# Patient Record
Sex: Female | Born: 1990 | Hispanic: Yes | Marital: Single | Smoking: Never smoker
Health system: Southern US, Community
[De-identification: ages and names within clinical notes are randomized; demographics above are authoritative.]

## PROBLEM LIST (undated history)

## (undated) DIAGNOSIS — Z789 Other specified health status: Secondary | ICD-10-CM

## (undated) HISTORY — PX: NO PAST SURGERIES: SHX2092

## (undated) HISTORY — DX: Other specified health status: Z78.9

---
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
## 2011-03-05 ENCOUNTER — Observation Stay: Payer: Self-pay | Admitting: Obstetrics and Gynecology

## 2011-03-05 LAB — URINALYSIS, COMPLETE
Bacteria: NONE SEEN
Bilirubin,UR: NEGATIVE
Blood: NEGATIVE
Glucose,UR: NEGATIVE mg/dL (ref 0–75)
Ketone: NEGATIVE
Ph: 7 (ref 4.5–8.0)
Specific Gravity: 1.008 (ref 1.003–1.030)
Squamous Epithelial: 1

## 2011-03-21 ENCOUNTER — Observation Stay: Payer: Self-pay

## 2011-05-23 ENCOUNTER — Inpatient Hospital Stay: Payer: Self-pay | Admitting: Obstetrics and Gynecology

## 2011-05-23 LAB — CBC WITH DIFFERENTIAL/PLATELET
Basophil #: 0 10*3/uL (ref 0.0–0.1)
Basophil %: 0.1 %
Eosinophil #: 0 10*3/uL (ref 0.0–0.7)
Eosinophil %: 0.4 %
HCT: 39 % (ref 35.0–47.0)
HGB: 12.9 g/dL (ref 12.0–16.0)
Lymphocyte #: 1 10*3/uL (ref 1.0–3.6)
MCHC: 33.1 g/dL (ref 32.0–36.0)
MCV: 90 fL (ref 80–100)
Monocyte #: 0.6 x10 3/mm (ref 0.2–0.9)
Monocyte %: 6.4 %
Neutrophil %: 81.9 %
RBC: 4.33 10*6/uL (ref 3.80–5.20)

## 2011-05-24 LAB — HEMOGLOBIN: HGB: 10.8 g/dL — ABNORMAL LOW (ref 12.0–16.0)

## 2011-06-17 ENCOUNTER — Emergency Department: Payer: Self-pay | Admitting: *Deleted

## 2011-06-17 LAB — CBC
HCT: 39.9 % (ref 35.0–47.0)
HGB: 12.8 g/dL (ref 12.0–16.0)
MCH: 28.3 pg (ref 26.0–34.0)
MCHC: 32.1 g/dL (ref 32.0–36.0)
MCV: 88 fL (ref 80–100)
RBC: 4.51 10*6/uL (ref 3.80–5.20)
RDW: 15.2 % — ABNORMAL HIGH (ref 11.5–14.5)
WBC: 8.2 10*3/uL (ref 3.6–11.0)

## 2011-06-17 LAB — URINALYSIS, COMPLETE
Bacteria: NEGATIVE
Bilirubin,UR: NEGATIVE
Protein: NEGATIVE
Specific Gravity: 1.014 (ref 1.003–1.030)

## 2011-06-17 LAB — COMPREHENSIVE METABOLIC PANEL
Albumin: 3.6 g/dL — ABNORMAL LOW (ref 3.8–5.6)
Bilirubin,Total: 0.2 mg/dL (ref 0.2–1.0)
Calcium, Total: 9 mg/dL (ref 9.0–10.7)
Chloride: 106 mmol/L (ref 98–107)
Co2: 26 mmol/L (ref 21–32)
EGFR (African American): >60
Glucose: 94 mg/dL (ref 65–99)
Osmolality: 283 (ref 275–301)
Potassium: 3.9 mmol/L (ref 3.5–5.1)
SGOT(AST): 30 U/L — ABNORMAL HIGH (ref 0–26)
SGPT (ALT): 46 U/L
Sodium: 141 mmol/L (ref 136–145)

## 2011-06-17 LAB — PREGNANCY, URINE: Pregnancy Test, Urine: NEGATIVE m[IU]/mL

## 2013-04-30 ENCOUNTER — Ambulatory Visit: Payer: Self-pay | Admitting: Advanced Practice Midwife

## 2013-06-05 ENCOUNTER — Observation Stay: Payer: Self-pay

## 2013-06-05 LAB — URINALYSIS, COMPLETE
BILIRUBIN, UR: NEGATIVE
BLOOD: NEGATIVE
Bacteria: NONE SEEN
GLUCOSE, UR: NEGATIVE mg/dL (ref 0–75)
KETONE: NEGATIVE
Leukocyte Esterase: NEGATIVE
Nitrite: NEGATIVE
PH: 8 (ref 4.5–8.0)
PROTEIN: NEGATIVE
RBC, UR: NONE SEEN /HPF (ref 0–5)
Specific Gravity: 1.009 (ref 1.003–1.030)
WBC UR: 1 /HPF (ref 0–5)

## 2013-09-16 ENCOUNTER — Observation Stay: Payer: Self-pay

## 2013-10-29 ENCOUNTER — Inpatient Hospital Stay: Payer: Self-pay

## 2013-10-29 LAB — GC/CHLAMYDIA PROBE AMP

## 2013-10-29 LAB — CBC WITH DIFFERENTIAL/PLATELET
BASOS ABS: 0.1 10*3/uL (ref 0.0–0.1)
Basophil %: 0.9 %
Eosinophil #: 0.2 10*3/uL (ref 0.0–0.7)
Eosinophil %: 2 %
HCT: 36.7 % (ref 35.0–47.0)
HGB: 11.9 g/dL — AB (ref 12.0–16.0)
LYMPHS PCT: 22.7 %
Lymphocyte #: 2.4 10*3/uL (ref 1.0–3.6)
MCH: 28.9 pg (ref 26.0–34.0)
MCHC: 32.4 g/dL (ref 32.0–36.0)
MCV: 89 fL (ref 80–100)
MONOS PCT: 9.5 %
Monocyte #: 1 x10 3/mm — ABNORMAL HIGH (ref 0.2–0.9)
Neutrophil #: 6.8 10*3/uL — ABNORMAL HIGH (ref 1.4–6.5)
Neutrophil %: 64.9 %
Platelet: 210 10*3/uL (ref 150–440)
RBC: 4.11 10*6/uL (ref 3.80–5.20)
RDW: 14.1 % (ref 11.5–14.5)
WBC: 10.5 10*3/uL (ref 3.6–11.0)

## 2013-10-29 LAB — RAPID HIV SCREEN (HIV 1/2 AB+AG)

## 2013-10-30 LAB — HEMATOCRIT: HCT: 27.6 % — ABNORMAL LOW (ref 35.0–47.0)

## 2014-06-21 NOTE — H&P (Signed)
L&D Evaluation:  History:  HPI 23yo Hispanic female speaks English with PNC at [facility] here for labor since 8p last night. Pt has a EDD of 10/28/13, No ROM, VB, decreased FM. Prenatal records are not  found in L&D amd pt acknowledges that she has no medical problems or hx.   Presents with contractions   Patient's Medical History No Chronic Illness   Patient's Surgical History none   Medications Pre Natal Vitamins   Allergies NKDA   Social History none   Family History Non-Contributory   ROS:  ROS All systems were reviewed.  HEENT, CNS, GI, GU, Respiratory, CV, Renal and Musculoskeletal systems were found to be normal.   Exam:  Vital Signs stable   General no apparent distress   Mental Status clear   Chest clear   Heart normal sinus rhythm, no murmur/gallop/rubs   Abdomen gravid, non-tender   Estimated Fetal Weight Average for gestational age   Back no CVAT   Reflexes 1+   Clonus negative   Pelvic last exam was 5/80/vtx   Mebranes Intact   FHT 145, pt sitting up and nurses readjusting toco   Ucx regular   Skin dry   Lymph no lymphadenopathy   Impression:  Impression active labor   Plan:  Plan antibiotics for GBBS prophylaxis   Comments Admit for delivery. Get [facility] records when they open. Antic SVD.   Electronic Signatures: Sharee PimpleJones, Akire Rennert W (CNM)  (Signed 18-Sep-15 07:38)  Authored: L&D Evaluation   Last Updated: 18-Sep-15 07:38 by Sharee PimpleJones, Carmellia Kreisler W (CNM)

## 2014-06-21 NOTE — H&P (Signed)
L&D Evaluation:  History:   HPI 24 y/o HF EDC 05/29/11 by 5 week U/S    Presents with back pain, bilateral    Patient's Medical History No Chronic Illness    Patient's Surgical History none    Medications Pre Natal Vitamins    Allergies NKDA    Social History none    Family History Non-Contributory   ROS:   ROS All systems were reviewed.  HEENT, CNS, GI, GU, Respiratory, CV, Renal and Musculoskeletal systems were found to be normal.   Exam:   Vital Signs stable    General no apparent distress    Chest clear    Abdomen gravid, non-tender    Estimated Fetal Weight Average for gestational age    Back no CVAT, indicates SI region bilaterally    Edema no edema    FHT normal rate with no decels    Ucx absent   Impression:   Impression back pain   Plan:   Plan UA, EFM/NST    Comments If no UTI, will d/c home with routine F/U If UTI, will treat   Electronic Signatures: Margaretha GlassingEvans, Ricky L (MD)  (Signed 22-Jan-13 13:54)  Authored: L&D Evaluation   Last Updated: 22-Jan-13 13:54 by Margaretha GlassingEvans, Ricky L (MD)

## 2017-06-30 ENCOUNTER — Ambulatory Visit (INDEPENDENT_AMBULATORY_CARE_PROVIDER_SITE_OTHER): Payer: BLUE CROSS/BLUE SHIELD | Admitting: Certified Nurse Midwife

## 2017-06-30 ENCOUNTER — Encounter: Payer: Self-pay | Admitting: Certified Nurse Midwife

## 2017-06-30 VITALS — BP 134/76 | HR 78 | Ht 63.0 in | Wt 208.0 lb

## 2017-06-30 DIAGNOSIS — Z202 Contact with and (suspected) exposure to infections with a predominantly sexual mode of transmission: Secondary | ICD-10-CM

## 2017-06-30 DIAGNOSIS — Z124 Encounter for screening for malignant neoplasm of cervix: Secondary | ICD-10-CM

## 2017-06-30 NOTE — Progress Notes (Signed)
GYNECOLOGY ANNUAL PREVENTATIVE CARE ENCOUNTER NOTE  Subjective:   Candice Sanders is a 27 y.o. No obstetric history on file. female here for a routine annual gynecologic exam.  Current complaints: none.   Denies abnormal vaginal bleeding, discharge, pelvic pain, problems with intercourse or other gynecologic concerns. Currently sexually active with 2 partners.    Gynecologic History No LMP recorded. Patient has had an implant. Contraception: Nexplanon, expired Last Pap: several years. Results were: normal per pt Last mammogram: N/A  Obstetric History OB History  Gravida Para Term Preterm AB Living  SAB TAB Ectopic Multiple Live Births          2    # Outcome Date GA Lbr Len/2nd Weight Sex Delivery Anes PTL Lv  2 Term 2014   7 lb 3 oz (3.26 kg) F Vag-Spont  N LIV  1 Term 2012   7 lb 3 oz (3.26 kg) M Vag-Spont  N LIV    History reviewed. No pertinent past medical history.  History reviewed. No pertinent surgical history.  Current Outpatient Medications on File Prior to Visit  Medication Sig Dispense Refill  . etonogestrel (NEXPLANON) 68 MG IMPL implant 1 each by Subdermal route once.     No current facility-administered medications on file prior to visit.     No Known Allergies  Social History   Socioeconomic History  . Marital status: Single    Spouse name: Not on file  . Number of children: Not on file  . Years of education: Not on file  . Highest education level: Not on file  Occupational History  . Not on file  Social Needs  . Financial resource strain: Not on file  . Food insecurity:    Worry: Not on file    Inability: Not on file  . Transportation needs:    Medical: Not on file    Non-medical: Not on file  Tobacco Use  . Smoking status: Never Smoker  . Smokeless tobacco: Never Used  Substance and Sexual Activity  . Alcohol use: Yes    Comment: occas  . Drug use: Never  . Sexual activity: Yes    Birth control/protection: Implant   Lifestyle  . Physical activity:    Days per week: Not on file    Minutes per session: Not on file  . Stress: Not on file  Relationships  . Social connections:    Talks on phone: Not on file    Gets together: Not on file    Attends religious service: Not on file    Active member of club or organization: Not on file    Attends meetings of clubs or organizations: Not on file    Relationship status: Not on file  . Intimate partner violence:    Fear of current or ex partner: Not on file    Emotionally abused: Not on file    Physically abused: Not on file    Forced sexual activity: Not on file  Other Topics Concern  . Not on file  Social History Narrative  . Not on file  exercise 2 x wk for 1 hr.  Eats balanced diet.   Family History  Problem Relation Age of Onset  . Diabetes Father     The following portions of the patient's history were reviewed and updated as appropriate: allergies, current medications, past family history, past medical history, past social history, past surgical history and problem list.  Review of  Systems Pertinent items noted in HPI and remainder of comprehensive ROS otherwise negative.   Objective:  BP 134/76   Pulse 78   Ht  (1.6 m)   Wt 208 lb (94.3 kg)   BMI 36.85 kg/m  CONSTITUTIONAL: Well-developed, well-nourished, obese female in no acute distress.  HENT:  Normocephalic, atraumatic, External right and left ear normal. Oropharynx is clear and moist EYES: Conjunctivae and EOM are normal. Pupils are equal, round, and reactive to light. No scleral icterus.  NECK: Normal range of motion, supple, no masses.  Normal thyroid.  SKIN: Skin is warm and dry. No rash noted. Not diaphoretic. No erythema. No pallor. NEUROLOGIC: Alert and oriented to person, place, and time. Normal reflexes, muscle tone coordination. No cranial nerve deficit noted. PSYCHIATRIC: Normal mood and affect. Normal behavior. Normal judgment and thought content. CARDIOVASCULAR:  Normal heart rate noted, regular rhythm RESPIRATORY: Clear to auscultation bilaterally. Effort and breath sounds normal, no problems with respiration noted. BREASTS: Symmetric in size. No masses, skin changes, nipple drainage, or lymphadenopathy. ABDOMEN: Soft, normal bowel sounds, no distention noted.  No tenderness, rebound or guarding.  PELVIC: Normal appearing external genitalia;Hadradentitison mons and noted and scarring on thighs. normal appearing vaginal mucosa and cervix.  Ectropion present, contact bleeding with pap. No abnormal discharge noted.  Pap smear obtained.  Normal uterine size, no other palpable masses, no uterine or adnexal tenderness. MUSCULOSKELETAL: Normal range of motion. No tenderness.  No cyanosis, clubbing, or edema.  2+ distal pulses.   Assessment and Plan:  There are no diagnoses linked to this encounter. Will follow up results of pap smear and manage accordingly. Mammogram not indicated Nuswab for possible exposure to STD Routine preventative health maintenance measures emphasized. Please refer to After Visit Summary for other counseling recommendations.  Return as soon as possible for nexplanon removal and re insertion.   Doreene Burke, CNM    Doreene Burke, CNM

## 2017-06-30 NOTE — Patient Instructions (Signed)
Preventive Care 18-39 Years, Female Preventive care refers to lifestyle choices and visits with your health care provider that can promote health and wellness. What does preventive care include?  A yearly physical exam. This is also called an annual well check.  Dental exams once or twice a year.  Routine eye exams. Ask your health care provider how often you should have your eyes checked.  Personal lifestyle choices, including: ? Daily care of your teeth and gums. ? Regular physical activity. ? Eating a healthy diet. ? Avoiding tobacco and drug use. ? Limiting alcohol use. ? Practicing safe sex. ? Taking vitamin and mineral supplements as recommended by your health care provider. What happens during an annual well check? The services and screenings done by your health care provider during your annual well check will depend on your age, overall health, lifestyle risk factors, and family history of disease. Counseling Your health care provider may ask you questions about your:  Alcohol use.  Tobacco use.  Drug use.  Emotional well-being.  Home and relationship well-being.  Sexual activity.  Eating habits.  Work and work Statistician.  Method of birth control.  Menstrual cycle.  Pregnancy history.  Screening You may have the following tests or measurements:  Height, weight, and BMI.  Diabetes screening. This is done by checking your blood sugar (glucose) after you have not eaten for a while (fasting).  Blood pressure.  Lipid and cholesterol levels. These may be checked every 5 years starting at age 66.  Skin check.  Hepatitis C blood test.  Hepatitis B blood test.  Sexually transmitted disease (STD) testing.  BRCA-related cancer screening. This may be done if you have a family history of breast, ovarian, tubal, or peritoneal cancers.  Pelvic exam and Pap test. This may be done every 3 years starting at age 40. Starting at age 59, this may be done every 5  years if you have a Pap test in combination with an HPV test.  Discuss your test results, treatment options, and if necessary, the need for more tests with your health care provider. Vaccines Your health care provider may recommend certain vaccines, such as:  Influenza vaccine. This is recommended every year.  Tetanus, diphtheria, and acellular pertussis (Tdap, Td) vaccine. You may need a Td booster every 10 years.  Varicella vaccine. You may need this if you have not been vaccinated.  HPV vaccine. If you are 69 or younger, you may need three doses over 6 months.  Measles, mumps, and rubella (MMR) vaccine. You may need at least one dose of MMR. You may also need a second dose.  Pneumococcal 13-valent conjugate (PCV13) vaccine. You may need this if you have certain conditions and were not previously vaccinated.  Pneumococcal polysaccharide (PPSV23) vaccine. You may need one or two doses if you smoke cigarettes or if you have certain conditions.  Meningococcal vaccine. One dose is recommended if you are age 27-21 years and a first-year college student living in a residence hall, or if you have one of several medical conditions. You may also need additional booster doses.  Hepatitis A vaccine. You may need this if you have certain conditions or if you travel or work in places where you may be exposed to hepatitis A.  Hepatitis B vaccine. You may need this if you have certain conditions or if you travel or work in places where you may be exposed to hepatitis B.  Haemophilus influenzae type b (Hib) vaccine. You may need this if  you have certain risk factors.  Talk to your health care provider about which screenings and vaccines you need and how often you need them. This information is not intended to replace advice given to you by your health care provider. Make sure you discuss any questions you have with your health care provider. Document Released: 03/26/2001 Document Revised: 10/18/2015  Document Reviewed: 11/29/2014 Elsevier Interactive Patient Education  [employer].

## 2017-06-30 NOTE — Progress Notes (Signed)
New pt is here for an annual exam. LPS 3 years ago and was normal She would also like the Nexplanon removed and reinserted.

## 2017-07-03 LAB — PAP IG W/ RFLX HPV ASCU: PAP SMEAR COMMENT: 0

## 2017-07-03 LAB — NUSWAB VAGINITIS PLUS (VG+)
Candida albicans, NAA: NEGATIVE
Candida glabrata, NAA: NEGATIVE
Chlamydia trachomatis, NAA: NEGATIVE
NEISSERIA GONORRHOEAE, NAA: NEGATIVE
Trich vag by NAA: NEGATIVE

## 2017-07-04 ENCOUNTER — Ambulatory Visit (INDEPENDENT_AMBULATORY_CARE_PROVIDER_SITE_OTHER): Payer: BLUE CROSS/BLUE SHIELD | Admitting: Certified Nurse Midwife

## 2017-07-04 ENCOUNTER — Encounter: Payer: Self-pay | Admitting: Certified Nurse Midwife

## 2017-07-04 VITALS — BP 109/70 | HR 81 | Ht 63.0 in | Wt 206.8 lb

## 2017-07-04 DIAGNOSIS — Z30017 Encounter for initial prescription of implantable subdermal contraceptive: Secondary | ICD-10-CM | POA: Diagnosis not present

## 2017-07-04 DIAGNOSIS — Z3046 Encounter for surveillance of implantable subdermal contraceptive: Secondary | ICD-10-CM

## 2017-07-04 NOTE — Patient Instructions (Signed)
Nexplanon Instructions After Insertion  Keep bandage clean and dry for 24 hours  May use ice/Tylenol/Ibuprofen for soreness or pain  If you develop fever, drainage or increased warmth from incision site-contact office immediately   

## 2017-07-04 NOTE — Progress Notes (Signed)
Candice Sanders is a 27 y.o. year old G35P2002 Hispanic female here for Nexplanon removal and reinsertion.  She was given informed consent for removal and reinsertion of her Nexplanon. Her Nexplanon was placed 3 yrs ago December, No LMP recorded (lmp unknown). Patient has had an implant.   Risks/benefits/side effects of Nexplanon have been discussed and her questions have been answered.  Specifically, a failure rate of 02/998 has been reported, with an increased failure rate if pt takes St. John's Wort and/or antiseizure medicaitons.  [name] is aware of the common side effect of irregular bleeding, which the incidence of decreases over time.  BP 109/70   Pulse 81   Ht  (1.6 m)   Wt 206 lb 12.8 oz (93.8 kg)   LMP  (LMP Unknown)   BMI 36.63 kg/m  No LMP recorded (lmp unknown). Patient has had an implant. No results found for this or any previous visit (from the past 24 hour(s)).   Appropriate time out taken. Nexplanon site identified.  Area prepped in usual sterile fashon. Two cc's of 2% lidocaine was used to anesthetize the area. A small stab incision was made right beside the implant on the distal portion.  The Nexplanon rod was grasped using hemostats and removed intact without difficulty.  The area was cleansed again with betadine and the Nexplanon was inserted per manufacturer's recommendations without difficulty.  Steri-strips and a pressure bandage was applied.  There was less than 3 cc blood loss. There were no complications.  The patient tolerated the procedure well.  She was instructed to keep the area clean and dry, remove pressure bandage in 24 hours, and keep insertion site covered with the steri-strips for 3-5 days.  She was given a card indicating date Nexplanon was inserted and date it needs to be removed.   Follow-up PRN problems.  Pattricia Boss Basil Buffin,CNM

## 2020-05-02 ENCOUNTER — Other Ambulatory Visit: Payer: Self-pay

## 2020-05-02 ENCOUNTER — Ambulatory Visit (INDEPENDENT_AMBULATORY_CARE_PROVIDER_SITE_OTHER): Payer: BC Managed Care – PPO | Admitting: Certified Nurse Midwife

## 2020-05-02 ENCOUNTER — Encounter: Payer: Self-pay | Admitting: Certified Nurse Midwife

## 2020-05-02 VITALS — BP 125/77 | HR 73 | Ht 62.0 in | Wt 212.8 lb

## 2020-05-02 DIAGNOSIS — Z3046 Encounter for surveillance of implantable subdermal contraceptive: Secondary | ICD-10-CM | POA: Diagnosis not present

## 2020-05-02 DIAGNOSIS — E669 Obesity, unspecified: Secondary | ICD-10-CM | POA: Insufficient documentation

## 2020-05-02 NOTE — Progress Notes (Signed)
Candice Sanders is a 30 y.o. year old G24P2002 Hispanic female here for Nexplanon removal and reinsertion.  She was given informed consent for removal and reinsertion of her Nexplanon. Her Nexplanon was placed 2019, No LMP recorded (lmp unknown). Patient has had an implant.,.  Risks/benefits/side effects of Nexplanon have been discussed and her questions have been answered.  Specifically, a failure rate of 02/998 has been reported, with an increased failure rate if pt takes St. John's Wort and/or antiseizure medicaitons.  [name] is aware of the common side effect of irregular bleeding, which the incidence of decreases over time.  BP 125/77   Pulse 73   Ht 5\' 2"  (1.575 m)   Wt 212 lb 12.8 oz (96.5 kg)   LMP  (LMP Unknown)   BMI 38.92 kg/m  No LMP recorded (lmp unknown). Patient has had an implant. No results found for this or any previous visit (from the past 24 hour(s)).   Appropriate time out taken. Nexplanon site identified.  Area prepped in usual sterile fashon. Two cc's of 2% lidocaine was used to anesthetize the area. A small stab incision was made right beside the implant on the distal portion.  The Nexplanon rod was grasped using hemostats and removed intact without difficulty.  The area was cleansed again with betadine and the Nexplanon was inserted per manufacturer's recommendations without difficulty.  Steri-strips and a pressure bandage was applied.  There was less than 3 cc blood loss. There were no complications.  The patient tolerated the procedure well.  She was instructed to keep the area clean and dry, remove pressure bandage in 24 hours, and keep insertion site covered with the steri-strips for 3-5 days.  She was given a card indicating date Nexplanon was inserted and date it needs to be removed.   Follow-up PRN problems.  , CNM

## 2020-05-02 NOTE — Patient Instructions (Signed)
Nexplanon Instructions After Insertion  Keep bandage clean and dry for 24 hours  May use ice/Tylenol/Ibuprofen for soreness or pain  If you develop fever, drainage or increased warmth from incision site-contact office immediately   

## 2021-02-01 ENCOUNTER — Other Ambulatory Visit: Payer: Self-pay | Admitting: Family Medicine

## 2021-02-01 DIAGNOSIS — R945 Abnormal results of liver function studies: Secondary | ICD-10-CM

## 2021-02-08 ENCOUNTER — Ambulatory Visit
Admission: RE | Admit: 2021-02-08 | Discharge: 2021-02-08 | Disposition: A | Discharge status: Home / Self Care | Payer: BC Managed Care – PPO | Source: Ambulatory Visit | Attending: Family Medicine | Admitting: Family Medicine

## 2021-02-08 ENCOUNTER — Other Ambulatory Visit: Payer: Self-pay

## 2021-02-08 DIAGNOSIS — R945 Abnormal results of liver function studies: Secondary | ICD-10-CM

## 2022-02-04 IMAGING — US US ABDOMEN LIMITED
1 series · 14 of 25 positions shown · non-contrast
Comparison: CT 06/17/2011

CLINICAL DATA: Abnormal liver function studies

EXAM:
ULTRASOUND ABDOMEN LIMITED RIGHT UPPER QUADRANT

[Series 1: us abdomen limited · 0.30mm/px · 14 of 47 slices shown]
[im 1/47]
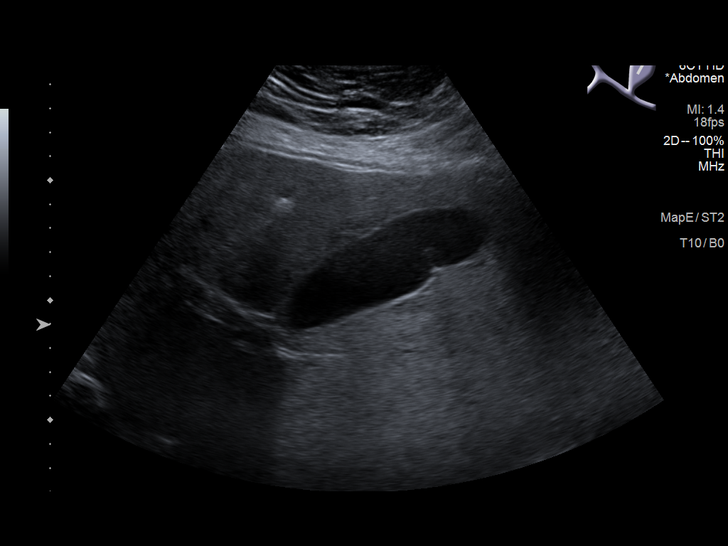
[im 4/47]
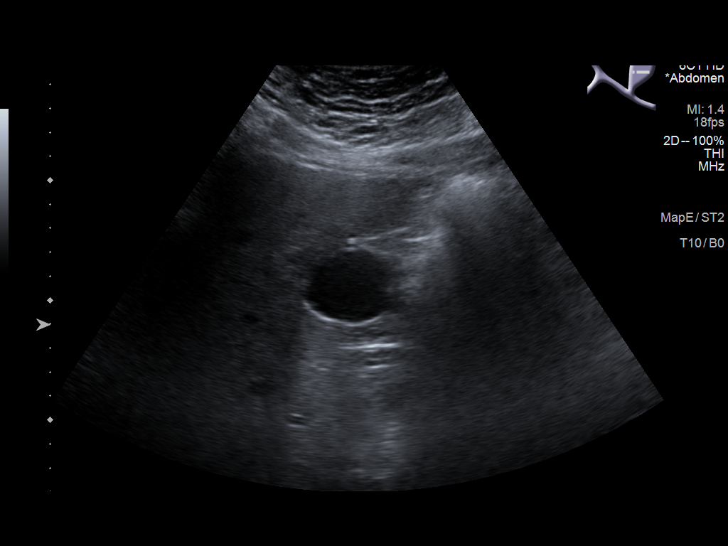
[im 8/47]
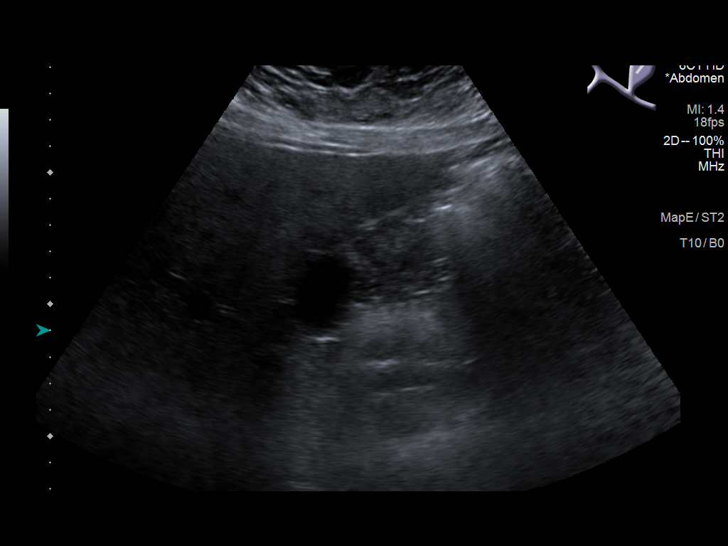
[im 12/47]
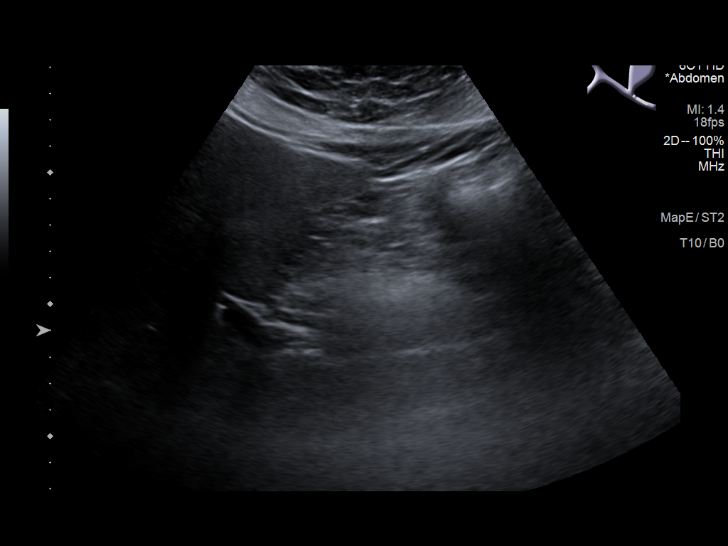
[im 16/47]
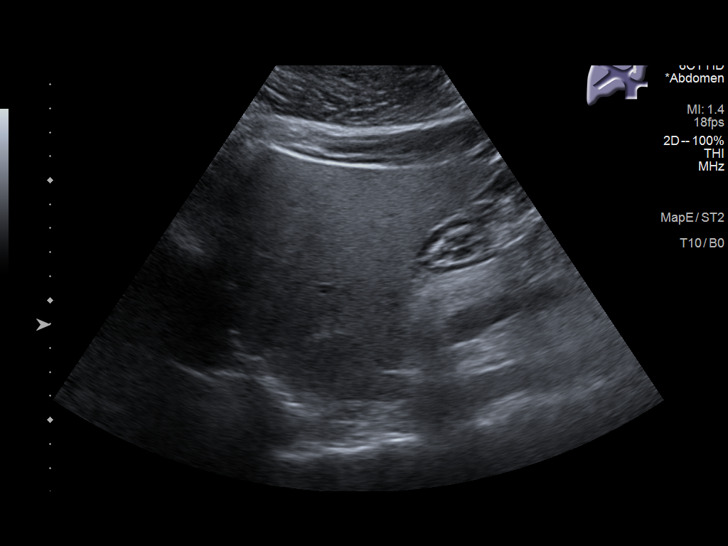
[im 18/47]
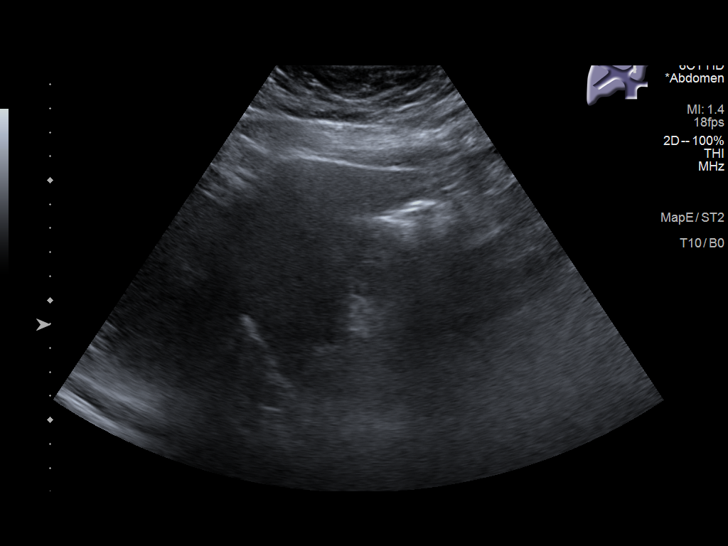
[im 22/47]
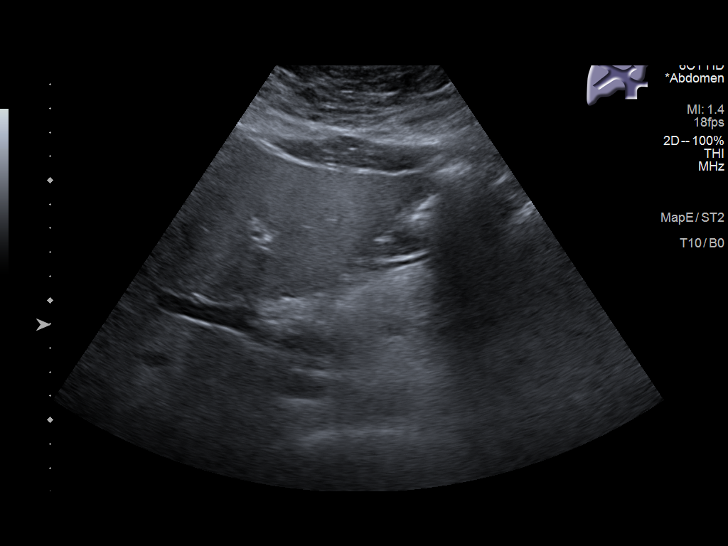
[im 25/47]
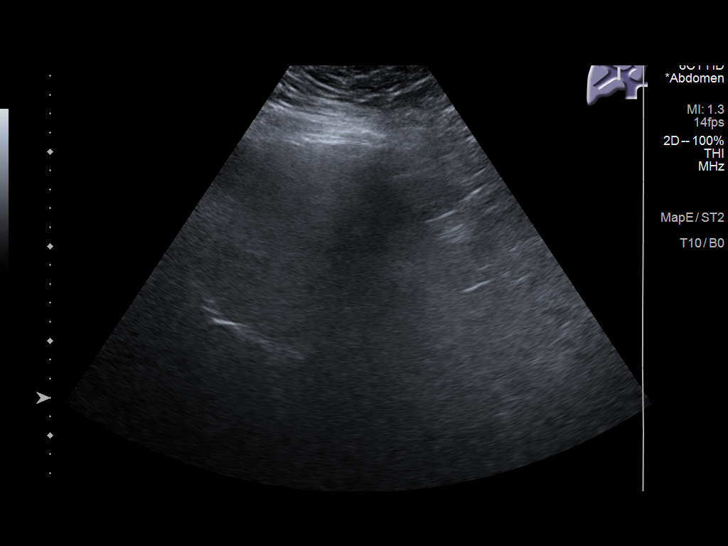
[im 29/47]
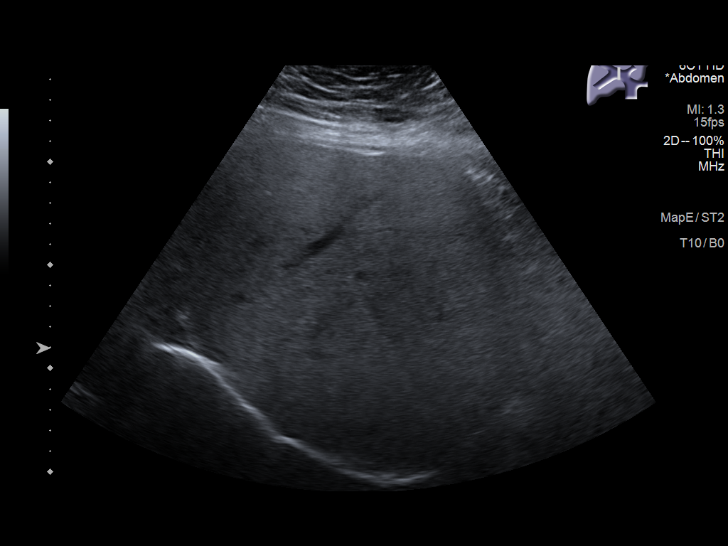
[im 31/47]
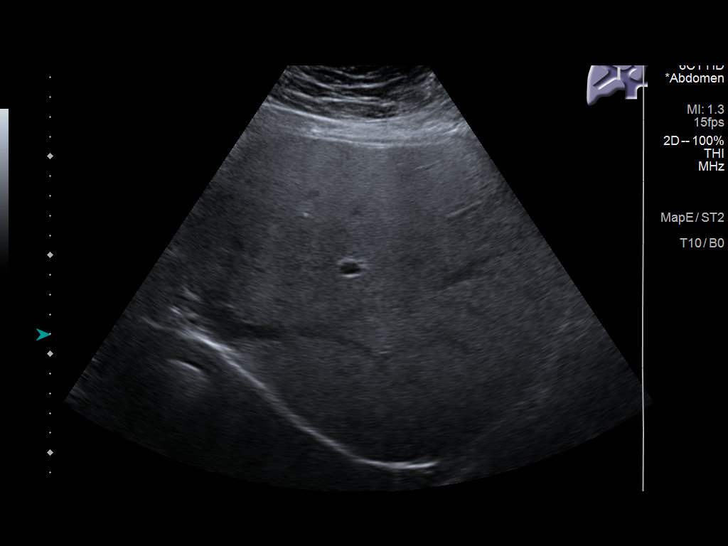
[im 35/47]
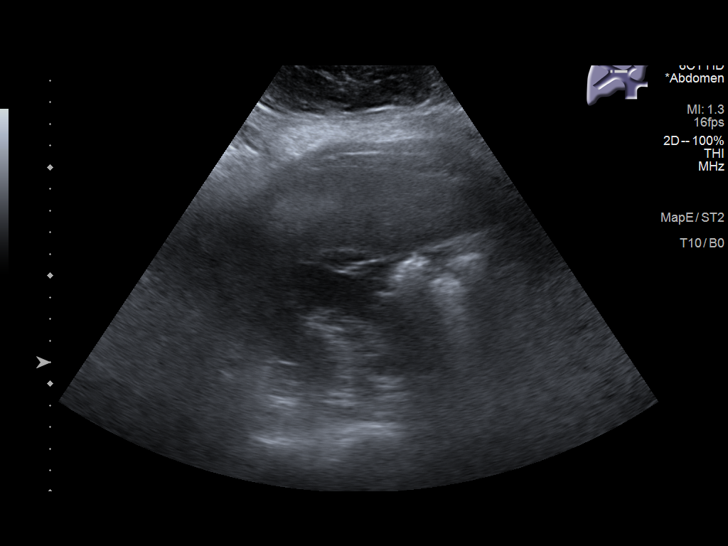
[im 39/47]
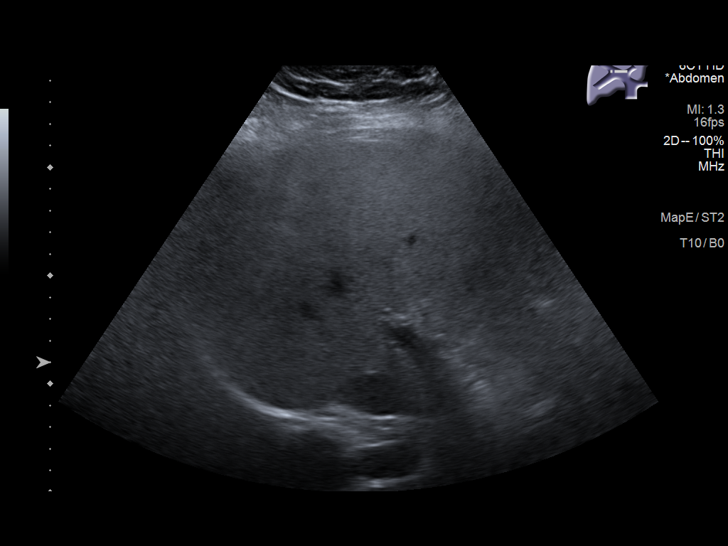
[im 43/47]
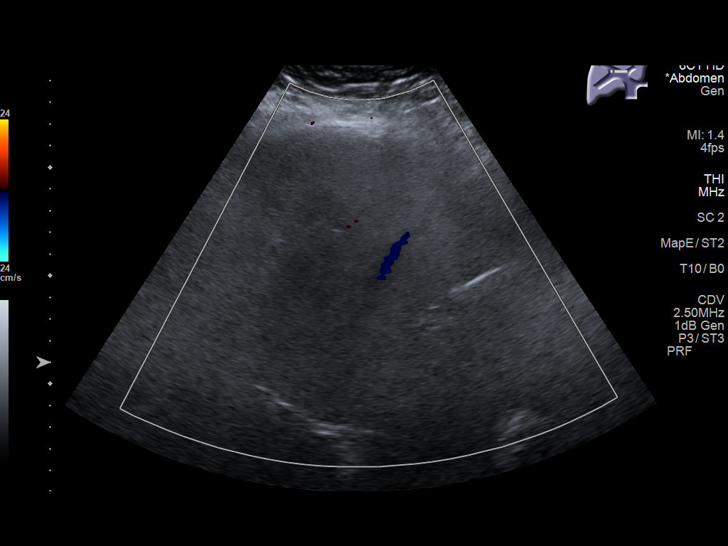
[im 47/47]
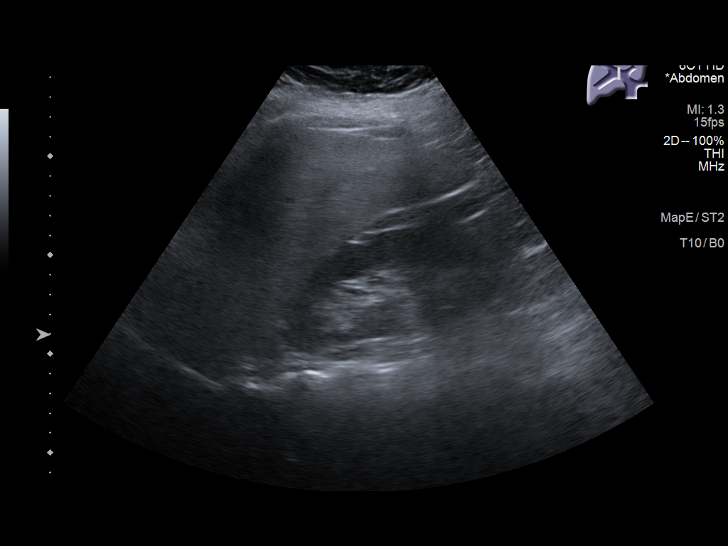

[14 of 25 positions shown; findings below may reference images not displayed]

FINDINGS: Gallbladder:

No gallstones or wall thickening visualized. No sonographic Murphy
sign noted by sonographer.

Common bile duct:

Diameter: 4 mm, normal

Liver:

Increased liver parenchymal echotexture suggesting fatty
infiltration. No focal lesions identified. Portal vein is patent on
color Doppler imaging with normal direction of blood flow towards
the liver.

Other: None.
IMPRESSION: No evidence of cholelithiasis or acute cholecystitis. Fatty
infiltration of the liver.

## 2023-04-29 ENCOUNTER — Ambulatory Visit: Admitting: Certified Nurse Midwife

## 2023-04-29 VITALS — BP 109/67 | HR 65 | Ht 62.0 in | Wt 219.6 lb

## 2023-04-29 DIAGNOSIS — Z3046 Encounter for surveillance of implantable subdermal contraceptive: Secondary | ICD-10-CM

## 2023-04-29 MED ORDER — ETONOGESTREL 68 MG ~~LOC~~ IMPL
68.0000 mg | DRUG_IMPLANT | Freq: Once | SUBCUTANEOUS | Status: AC
  Administered 2023-04-29: 68 mg via SUBCUTANEOUS

## 2023-04-29 NOTE — Patient Instructions (Signed)
 Nexplanon Instructions After Insertion  Keep bandage clean and dry for 24 hours  May use ice/Tylenol/Ibuprofen for soreness or pain  If you develop fever, drainage or increased warmth from incision site-contact office immediately

## 2023-04-29 NOTE — Progress Notes (Signed)
 Candice Sanders is a 33 y.o. year old G52P2002 Hispanic female here for Nexplanon removal and reinsertion.  She was given informed consent for removal and reinsertion of her Nexplanon. Her Nexplanon was placed 3 years agi, No LMP recorded. Patient has had an implant..  Risks/benefits/side effects of Nexplanon have been discussed and her questions have been answered.  Specifically, a failure rate of 02/998 has been reported, with an increased failure rate if pt takes St. John's Wort and/or antiseizure medicaitons.  [name] is aware of the common side effect of irregular bleeding, which the incidence of decreases over time.  BP 109/67   Pulse 65   Ht 5\' 2"  (1.575 m)   Wt 219 lb 9.6 oz (99.6 kg)   BMI 40.17 kg/m  No LMP recorded. Patient has had an implant. No results found for this or any previous visit (from the past 24 hours).   Appropriate time out taken. Nexplanon site identified.  Area prepped in usual sterile fashon. Two cc's of 2% lidocaine was used to anesthetize the area. A small stab incision was made right beside the implant on the distal portion.  The Nexplanon rod was grasped using hemostats and removed intact without difficulty.  The area was cleansed again with betadine and the Nexplanon was inserted per manufacturer's recommendations without difficulty.  Steri-strips and a pressure bandage was applied.  There was less than 3 cc blood loss. There were no complications.  The patient tolerated the procedure well.  She was instructed to keep the area clean and dry, remove pressure bandage in 24 hours, and keep insertion site covered with the steri-strips for 3-5 days.  She was given a card indicating date Nexplanon was inserted and date it needs to be removed.   Follow-up PRN problems.  Doreene Burke,  CNM

## 2023-04-29 NOTE — Progress Notes (Signed)
      GYNECOLOGY OFFICE PROCEDURE NOTE  Candice Sanders is a 33 y.o. [phone] here for Nexplanon removal and Nexplanon insertion.  Last pap smear was >9years ago patient repoirts and was normal.  No other gynecologic concerns.  Nexplanon Insertion Procedure Patient identified, informed consent performed, consent signed.   Patient does understand that irregular bleeding is a very common side effect of this medication. She was advised to have backup contraception for one week after placement. Pregnancy test in clinic today was negative.  Appropriate time out taken.  Patient's left arm was prepped and draped in the usual sterile fashion. The ruler used to measure and mark insertion area.  Patient was prepped with alcohol swab and then injected with 3 ml of 1% lidocaine.  She was prepped with betadine, Nexplanon removed from packaging,  Device confirmed in needle, then inserted full length of needle and withdrawn per handbook instructions. Nexplanon was able to palpated in the patient's arm; patient palpated the insert herself. There was minimal blood loss.  Patient insertion site covered with guaze and a pressure bandage to reduce any bruising.  The patient tolerated the procedure well and was given post procedure instructions.   Nexplanon Removal Patient identified, informed consent performed, consent signed.   Appropriate time out taken. Nexplanon site identified.  Area prepped in usual sterile fashon. One ml of 1% lidocaine was used to anesthetize the area at the distal end of the implant. A small stab incision was made right beside the implant on the distal portion.  The Nexplanon rod was grasped using hemostats and removed without difficulty.  There was minimal blood loss. There were no complications.  3 ml of 1% lidocaine was injected around the incision for post-procedure analgesia.  Steri-strips were applied over the small incision.  A pressure bandage was applied to reduce any bruising.   The patient tolerated the procedure well and was given post procedure instructions.  Patient is planning to use nexplanon for contraception/attempt conception.  Nexplanon Removal and Reinsertion Patient identified, informed consent performed, consent signed.   Patient does understand that irregular bleeding is a very common side effect of this medication. She was advised to have backup contraception for one week after replacement of the implant. Pregnancy test in clinic today was negative.  Appropriate time out taken. Nexplanon site identified in left arm.  Area prepped in usual sterile fashon. One ml of 1% lidocaine was used to anesthetize the area at the distal end of the implant. A small stab incision was made right beside the implant on the distal portion. The Nexplanon rod was grasped using hemostats and removed without difficulty. There was minimal blood loss. There were no complications. Area was then injected with 3 ml of 1 % lidocaine. She was re-prepped with betadine, Nexplanon removed from packaging, Device confirmed in needle, then inserted full length of needle and withdrawn per handbook instructions. Nexplanon was able to palpated in the patient's arm; patient palpated the insert herself.  There was minimal blood loss. Patient insertion site covered with gauze and a pressure bandage to reduce any bruising. The patient tolerated the procedure well and was given post procedure instructions.  She was advised to have backup contraception for one week.
